# Patient Record
Sex: Male | Born: 2005 | Race: Black or African American | Hispanic: No | Marital: Single | Smoking: Current some day smoker
Health system: Southern US, Community
[De-identification: ages and names within clinical notes are randomized; demographics above are authoritative.]

---
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
## 2024-04-06 ENCOUNTER — Other Ambulatory Visit: Payer: Self-pay

## 2024-04-06 ENCOUNTER — Emergency Department (HOSPITAL_BASED_OUTPATIENT_CLINIC_OR_DEPARTMENT_OTHER)
Admission: EM | Admit: 2024-04-06 | Discharge: 2024-04-06 | Disposition: A | Discharge status: Home / Self Care | Attending: Emergency Medicine | Admitting: Emergency Medicine

## 2024-04-06 ENCOUNTER — Encounter (HOSPITAL_BASED_OUTPATIENT_CLINIC_OR_DEPARTMENT_OTHER): Payer: Self-pay | Admitting: Emergency Medicine

## 2024-04-06 DIAGNOSIS — F17200 Nicotine dependence, unspecified, uncomplicated: Secondary | ICD-10-CM | POA: Insufficient documentation

## 2024-04-06 DIAGNOSIS — H9202 Otalgia, left ear: Secondary | ICD-10-CM | POA: Diagnosis present

## 2024-04-06 DIAGNOSIS — H6121 Impacted cerumen, right ear: Secondary | ICD-10-CM | POA: Diagnosis not present

## 2024-04-06 DIAGNOSIS — H7292 Unspecified perforation of tympanic membrane, left ear: Secondary | ICD-10-CM | POA: Insufficient documentation

## 2024-04-06 MED ORDER — DOCUSATE SODIUM 50 MG/5ML PO LIQD: 50.0000 mg | Freq: Once | ORAL | Status: DC

## 2024-04-06 MED ORDER — DOCUSATE SODIUM 50 MG/5ML PO LIQD
50.0000 mg | ORAL | Status: AC
  Administered 2024-04-06: 50 mg via OTIC
  Filled 2024-04-06: qty 10

## 2024-04-06 NOTE — ED Triage Notes (Signed)
 Pt c/o pain to LT ear after poking it with the end of a floss stick on Wed or Thurs

## 2024-04-06 NOTE — ED Provider Notes (Signed)
 [facility] Provider Note   CSN: [id number] Arrival date & time: 04/06/24  1058     Patient presents with: Otalgia   Albert Maldonado is a 18 y.o. male.   18 year old male presenting with muffled hearing in left ear.  Thursday the patient was trying to get earwax out of his ear with the end of a floss pick, this caused him to experience ear pain, since then he reports muffled hearing in that ear.  He denies continued ear pain or drainage from the ear.  No fever.  No other complaints.   Otalgia      Prior to Admission medications   Medication Sig Start Date End Date Taking? Authorizing Provider  cetirizine  HCl (ZYRTEC ) 5 MG/5ML SYRP Take 5 mLs (5 mg total) by mouth at bedtime. 11/11/14   Patria Perkins, MD    Allergies: Patient has no known allergies.    Review of Systems  HENT:  Positive for ear pain.     Updated Vital Signs BP 133/85 (BP Location: Right Arm)   Pulse 63   Temp 99 F (37.2 C)   Resp 15   Ht 5' 7 (1.702 m)   Wt 57.2 kg   SpO2 100%   BMI 19.73 kg/m   Physical Exam Vitals and nursing note reviewed.  HENT:     Head: Normocephalic.     Right Ear: There is impacted cerumen.     Left Ear: There is impacted cerumen.  Eyes:     Extraocular Movements: Extraocular movements intact.  Cardiovascular:     Rate and Rhythm: Normal rate.  Pulmonary:     Effort: Pulmonary effort is normal.  Musculoskeletal:     Cervical back: Normal range of motion.     Comments: Moves all extremities without difficulty  Skin:    General: Skin is warm and dry.  Neurological:     Mental Status: He is alert.     (all labs ordered are listed, but only abnormal results are displayed) Labs Reviewed - No data to display  EKG: None  Radiology: No results found.   Procedures   Medications Ordered in the ED - No data to display                                  Medical Decision Making This patient presents to the  ED for concern of ear pain, this involves an extensive number of treatment options, and is a complaint that carries with it a high risk of complications and morbidity.  The differential diagnosis includes cerumen impaction, TM rupture, AOM, otitis externa.  Cardiac Monitoring: / EKG:  The patient was maintained on a cardiac monitor.  I personally viewed and interpreted the cardiac monitored which showed an underlying rhythm of: NSR   Problem List / ED Course / Critical interventions / Medication management  I ordered medication including Colace for instillation into the left ear to soften cerumen and attempt to manually remove it Reevaluation of the patient after these medicines showed that the patient improved   Social Determinants of Health:  Tobacco use   Test / Admission - Considered:  Physical exam was notable for bilateral cerumen impaction, I attempted to remove the cerumen from the left ear using an ear curette, however the cerumen was dense, therefore I had nursing staff instill Colace into the left ear to soften the earwax for easier removal,  additional wax was removed using a lighted ear curette.  After removal of additional earwax, there was blood visualized blocking the tympanic membrane.  I asked for Dr. Yolande to examine this patient as well, he found there to be a tympanic perforation that was not visualized behind the cerumen initially due to the degree of impaction.  I advised the patient to keep the area clean and dry, will provide him with the contact information for ENT to schedule follow-up. I messaged Dr. Soldatova via secure chat to make them aware of this patient, they agree that he is appropriate for outpatient follow-up and had no other recommendations/instructions for the patient at this time.  The patient voiced understanding and is in agreement this plan, return precautions discussed, he is appropriate discharge at this time.    Risk OTC drugs.         Final diagnoses:  Perforation of left tympanic membrane    ED Discharge Orders     None          Glendia Rocky LOISE DEVONNA 04/06/24 1414    Yolande Lamar BROCKS, MD 04/12/24 1340

## 2024-04-06 NOTE — Discharge Instructions (Addendum)
 I have provided you with the contact information for an ear nose and throat specialist, please call their office tomorrow to schedule follow-up.  Keep the area clean and dry until then.  Return to the emergency department if your symptoms worsen.
